# Patient Record
Sex: Male | Born: 1960 | Race: White | Hispanic: No | Marital: Single | Smoking: Current every day smoker
Health system: Southern US, Community
[De-identification: ages and names within clinical notes are randomized; demographics above are authoritative.]

## PROBLEM LIST (undated history)

## (undated) DIAGNOSIS — R351 Nocturia: Secondary | ICD-10-CM

## (undated) DIAGNOSIS — E785 Hyperlipidemia, unspecified: Secondary | ICD-10-CM

## (undated) HISTORY — DX: Nocturia: R35.1

## (undated) HISTORY — DX: Hyperlipidemia, unspecified: E78.5

---

## 2006-09-29 ENCOUNTER — Emergency Department: Payer: Self-pay | Admitting: Emergency Medicine

## 2006-09-30 ENCOUNTER — Other Ambulatory Visit: Payer: Self-pay

## 2006-09-30 ENCOUNTER — Emergency Department: Payer: Self-pay | Admitting: Emergency Medicine

## 2006-10-04 ENCOUNTER — Inpatient Hospital Stay: Payer: Self-pay | Admitting: Internal Medicine

## 2009-02-23 IMAGING — CT CT HEAD WITHOUT CONTRAST
2 series · 16 of 30 positions shown, 20 images · non-contrast
Comparison: none

REASON FOR EXAM: Headache. Rm rme 3
COMMENTS:

[Series 2: without · axial · non-contrast · 0.40mm/px · z∈[+1156,+1276]mm · 13 of 30 slices shown, 17 images]
[im 3/30  brain]
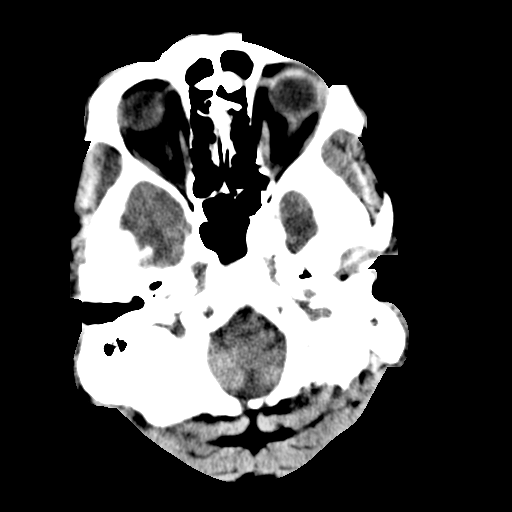
[im 3/30  bone]
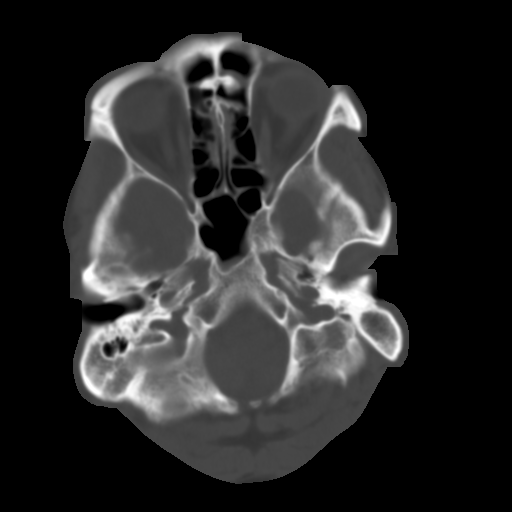
[im 5/30  brain]
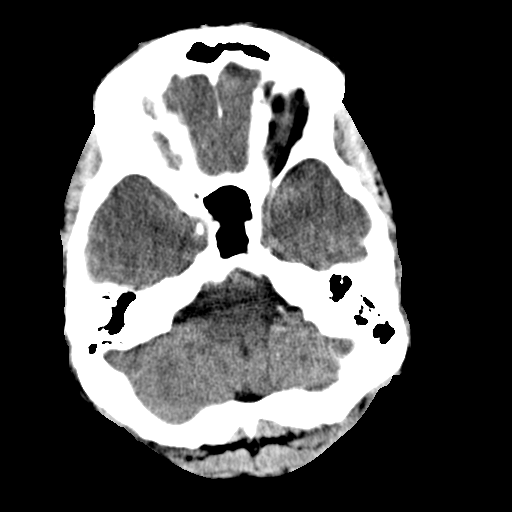
[im 7/30  brain]
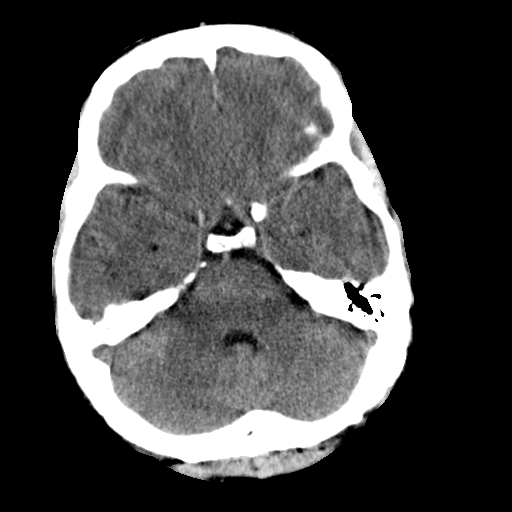
[im 9/30  brain]
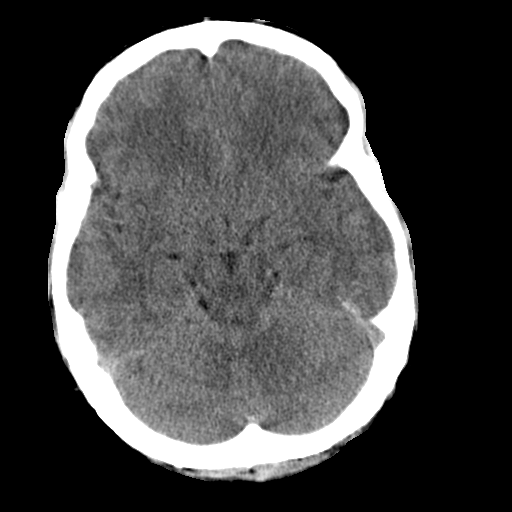
[im 11/30  brain]
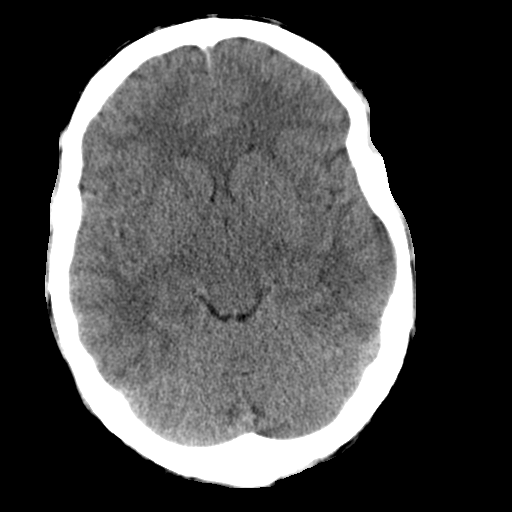
[im 11/30  bone]
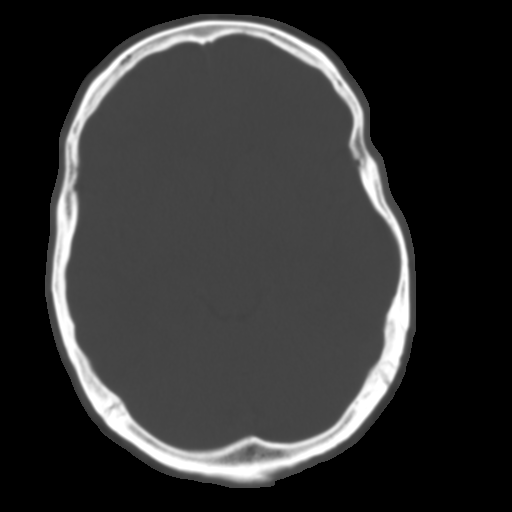
[im 13/30  brain]
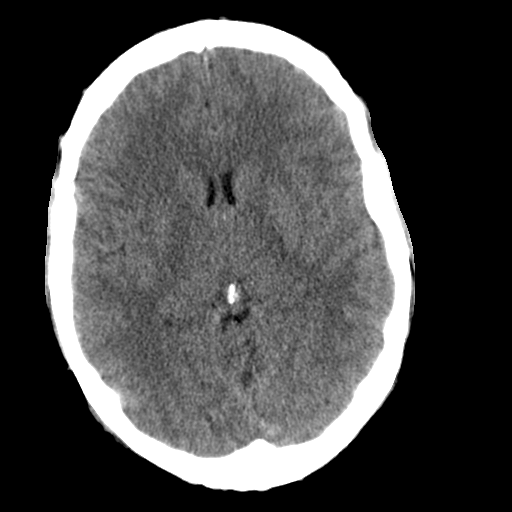
[im 15/30  brain]
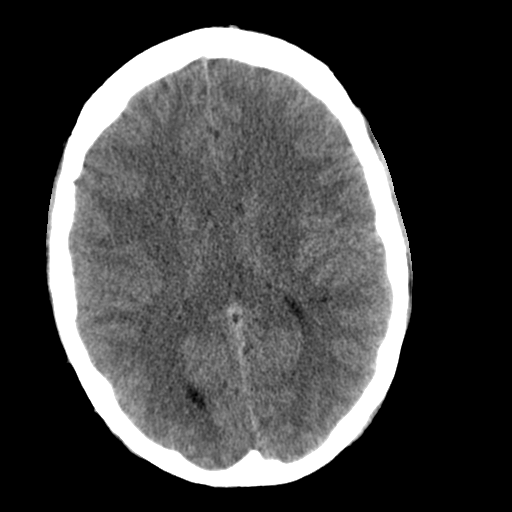
[im 17/30  brain]
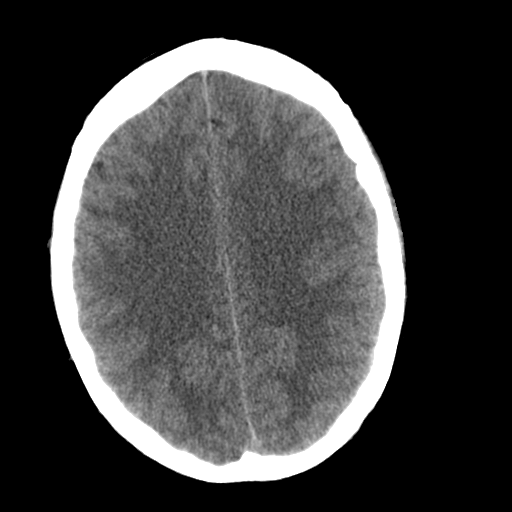
[im 19/30  brain]
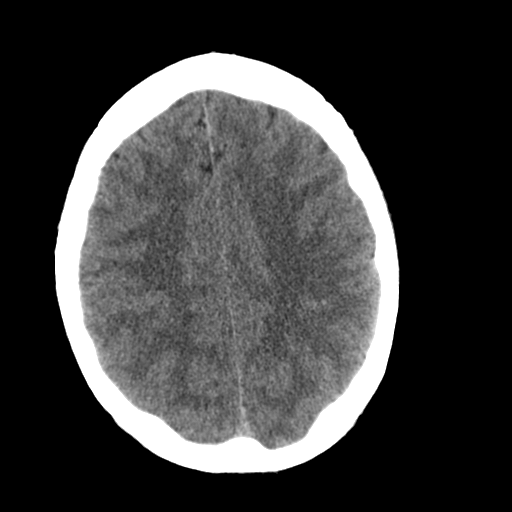
[im 19/30  bone]
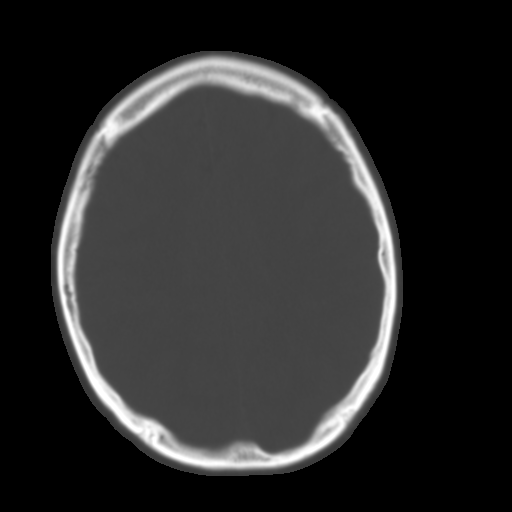
[im 21/30  brain]
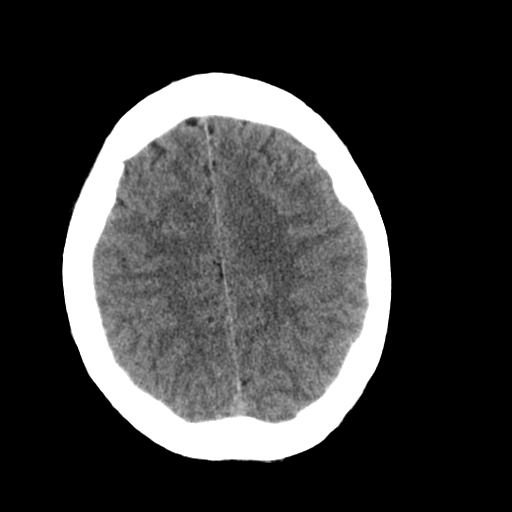
[im 23/30  brain]
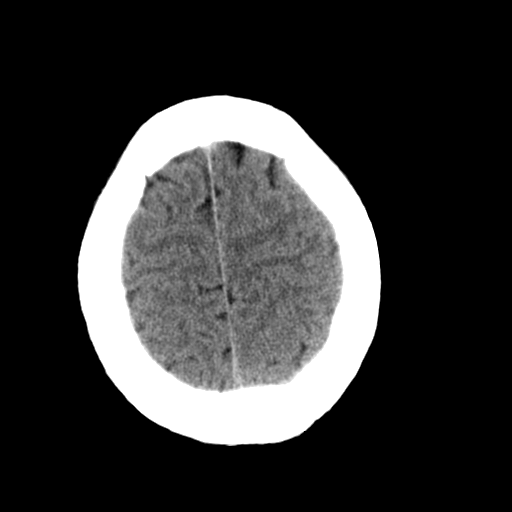
[im 25/30  brain]
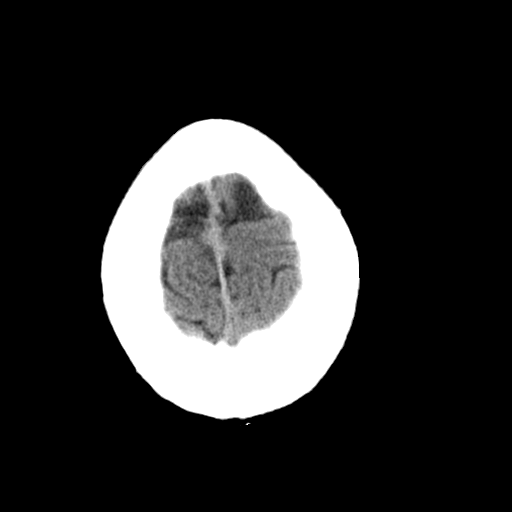
[im 27/30  brain]
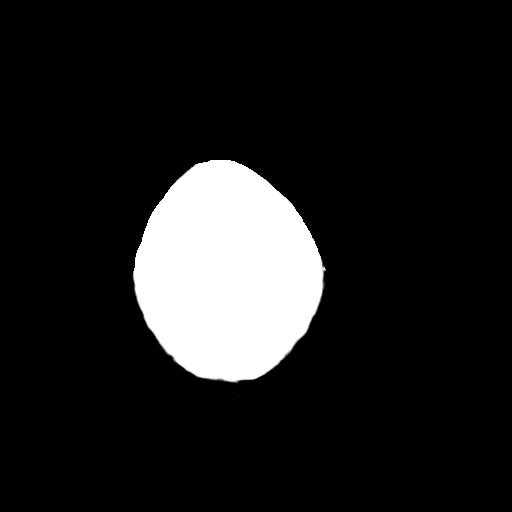
[im 27/30  bone]
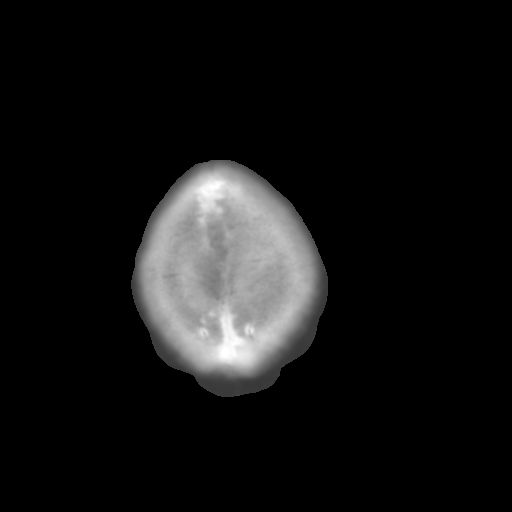

[Series 3: bone · axial · 0.40mm/px · z∈[+1156,+1196]mm · 3 of 30 slices shown]
[im 3/30  bone]
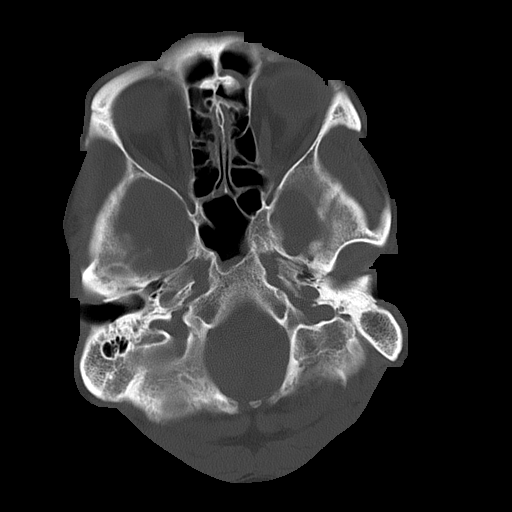
[im 7/30  bone]
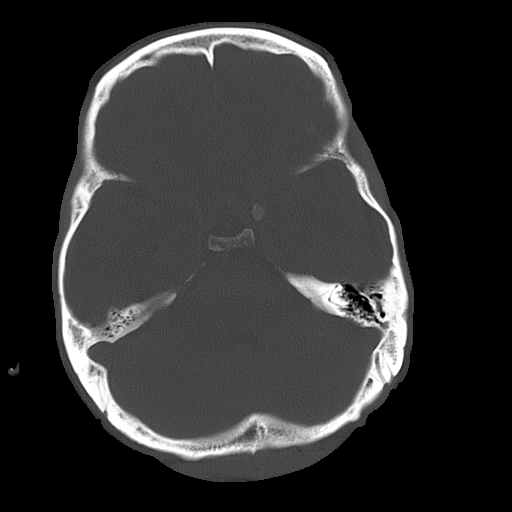
[im 11/30  bone]
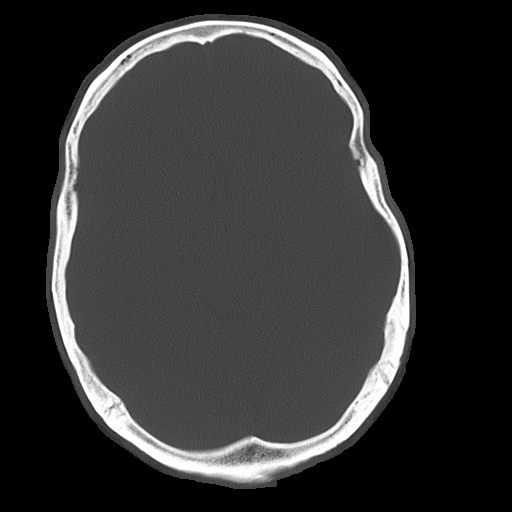

[16 of 30 positions shown; findings below may reference images not displayed]

PROCEDURE:     CT  - CT HEAD WITHOUT CONTRAST  - September 30, 2006  [DATE]

RESULT:     Axial unenhanced images were obtained on an emergency basis.
The report was faxed to the emergency room.  No subarachnoid hemorrhage.  No
intracerebral bleeds or infarcts.  No mass effect.  No shift of the midline.
 No extraaxial fluid collections are evident.  The ventricles appear within
normal limits.  On the bone window settings, the sinuses and mastoids appear
clear.
IMPRESSION: No acute intracranial abnormalities noted.

## 2012-05-02 ENCOUNTER — Ambulatory Visit: Payer: Self-pay | Admitting: Physician Assistant

## 2015-01-09 ENCOUNTER — Ambulatory Visit
Admission: EM | Admit: 2015-01-09 | Discharge: 2015-01-09 | Disposition: A | Discharge status: Home / Self Care | Payer: BLUE CROSS/BLUE SHIELD | Attending: Family Medicine | Admitting: Family Medicine

## 2015-01-09 DIAGNOSIS — F17210 Nicotine dependence, cigarettes, uncomplicated: Secondary | ICD-10-CM | POA: Insufficient documentation

## 2015-01-09 DIAGNOSIS — M79641 Pain in right hand: Secondary | ICD-10-CM | POA: Diagnosis present

## 2015-01-09 DIAGNOSIS — L02511 Cutaneous abscess of right hand: Secondary | ICD-10-CM | POA: Insufficient documentation

## 2015-01-09 MED ORDER — CEFTRIAXONE SODIUM 1 G IJ SOLR
1.0000 g | Freq: Once | INTRAMUSCULAR | Status: AC
  Administered 2015-01-09: 1 g via INTRAMUSCULAR

## 2015-01-09 MED ORDER — AMOXICILLIN-POT CLAVULANATE 875-125 MG PO TABS: 1.0000 | ORAL_TABLET | Freq: Two times a day (BID) | ORAL | Status: DC

## 2015-01-09 NOTE — ED Notes (Signed)
Right index finger cleaned with NS and antibiotic ointment and dry gauze dressing applied.

## 2015-01-09 NOTE — Discharge Instructions (Signed)
Abscess An abscess is an infected area that contains a collection of pus and debris.It can occur in almost any part of the body. An abscess is also known as a furuncle or boil. CAUSES  An abscess occurs when tissue gets infected. This can occur from blockage of oil or sweat glands, infection of hair follicles, or a minor injury to the skin. As the body tries to fight the infection, pus collects in the area and creates pressure under the skin. This pressure causes pain. People with weakened immune systems have difficulty fighting infections and get certain abscesses more often.  SYMPTOMS Usually an abscess develops on the skin and becomes a painful mass that is red, warm, and tender. If the abscess forms under the skin, you may feel a moveable soft area under the skin. Some abscesses break open (rupture) on their own, but most will continue to get worse without care. The infection can spread deeper into the body and eventually into the bloodstream, causing you to feel ill.  DIAGNOSIS  Your caregiver will take your medical history and perform a physical exam. A sample of fluid may also be taken from the abscess to determine what is causing your infection. TREATMENT  Your caregiver may prescribe antibiotic medicines to fight the infection. However, taking antibiotics alone usually does not cure an abscess. Your caregiver may need to make a small cut (incision) in the abscess to drain the pus. In some cases, gauze is packed into the abscess to reduce pain and to continue draining the area. HOME CARE INSTRUCTIONS   Only take over-the-counter or prescription medicines for pain, discomfort, or fever as directed by your caregiver.  If you were prescribed antibiotics, take them as directed. Finish them even if you start to feel better.  If gauze is used, follow your caregiver's directions for changing the gauze.  To avoid spreading the infection:  Keep your draining abscess covered with a  bandage.  Wash your hands well.  Do not share personal care items, towels, or whirlpools with others.  Avoid skin contact with others.  Keep your skin and clothes clean around the abscess.  Keep all follow-up appointments as directed by your caregiver. SEEK MEDICAL CARE IF:   You have increased pain, swelling, redness, fluid drainage, or bleeding.  You have muscle aches, chills, or a general ill feeling.  You have a fever. MAKE SURE YOU:   Understand these instructions.  Will watch your condition.  Will get help right away if you are not doing well or get worse. Document Released: 04/16/2005 Document Revised: 01/06/2012 Document Reviewed: 09/19/2011 [facility] Patient Information 2015 [place], [place]. This information is not intended to replace advice given to you by your health care provider. Make sure you discuss any questions you have with your health care provider.   Warm moist compresses to area Follow up on Friday

## 2015-01-09 NOTE — ED Notes (Signed)
Pt states "I have an infection in my right index finger, my dog bite me about a month ago it was healing but now seems infected. My dog has all of his vaccines."

## 2015-01-09 NOTE — ED Provider Notes (Signed)
CSN: [id number]     Arrival date & time 01/09/15  1658 History   First MD Initiated Contact with Patient 01/09/15 1752     Chief Complaint  Patient presents with  . Hand Pain   (Consider location/radiation/quality/duration/timing/severity/associated sxs/prior Treatment) HPI Comments: 54 yo male with a complaint of infection to his right index finger. Patient states he was bitten at the site by his dog (which is up to date on vaccines per patient) about a month ago. States that it was healing well, but patient started using his hand more, putting more pressure with work and [employer]. Now for the past 4-5 days has noticed more discomfort and swelling.   Patient is a 54 y.o. male presenting with hand pain. The history is provided by the patient.  Hand Pain    History reviewed. No pertinent past medical history. History reviewed. No pertinent past surgical history. History reviewed. No pertinent family history. History  Substance Use Topics  . Smoking status: Current Every Day Smoker  . Smokeless tobacco: Not on file  . Alcohol Use: Yes    Review of Systems  Allergies  Review of patient's allergies indicates no known allergies.  Home Medications   Prior to Admission medications   Medication Sig Start Date End Date Taking? Authorizing Provider  amoxicillin-clavulanate (AUGMENTIN) 875-125 MG per tablet Take 1 tablet by mouth 2 (two) times daily. 01/09/15   Payton Mccallum, MD   BP 129/88 mmHg  Pulse 55  Temp(Src) 98.1 F (36.7 C) (Tympanic)  Resp 16  Ht 5\' 10"  (1.778 m)  Wt 165 lb (74.844 kg)  BMI 23.68 kg/m2  SpO2 98% Physical Exam  Constitutional: He appears well-developed and well-nourished. No distress.  Musculoskeletal:       Right hand: He exhibits tenderness. He exhibits normal range of motion, normal capillary refill, no deformity and no laceration. Normal sensation noted. Normal strength noted.       Hands: Medial PIP area skin on right index finger with slight  swelling with visible pus underneath the skin; no drainage; positive tenderness to palpation; normal range of motion; hand neurovascularly intact  Skin: He is not diaphoretic.  Nursing note and vitals reviewed.   ED Course  Procedures (including critical care time) Labs Review Labs Reviewed  CULTURE, ROUTINE-ABSCESS    Imaging Review No results found.   MDM   1. Abscess of finger, right   (index finger; medial PIP skin)  New Prescriptions   AMOXICILLIN-CLAVULANATE (AUGMENTIN) 875-125 MG PER TABLET    Take 1 tablet by mouth 2 (two) times daily.  Plan: 1. diagnosis reviewed with patient; area skin cleaned and prepped in sterile fashion; skin anesthetized with 1% lidocaine without epinephrine; small superficial incision (43mm) made with a #11 blade with expression of purulent drainage; sample obtained for culture; area bandaged 2. rx as per orders; risks, benefits, potential side effects reviewed with patient 3. Recommend supportive treatment with warm compresses to affected 4.  Patient given Rocephin 1gm IM x 1 in clinic 5. F/u in 3 days or sooner prn if symptoms worsen or don't improve    Payton Mccallum, MD 01/09/15 1845

## 2015-01-14 LAB — CULTURE, ROUTINE-ABSCESS: SPECIAL REQUESTS: NORMAL

## 2015-10-10 ENCOUNTER — Encounter: Payer: Self-pay | Admitting: Podiatry

## 2015-10-10 ENCOUNTER — Ambulatory Visit (INDEPENDENT_AMBULATORY_CARE_PROVIDER_SITE_OTHER): Payer: BLUE CROSS/BLUE SHIELD

## 2015-10-10 ENCOUNTER — Ambulatory Visit (INDEPENDENT_AMBULATORY_CARE_PROVIDER_SITE_OTHER): Payer: BLUE CROSS/BLUE SHIELD | Admitting: Podiatry

## 2015-10-10 VITALS — BP 133/82 | HR 57 | Resp 16

## 2015-10-10 DIAGNOSIS — M722 Plantar fascial fibromatosis: Secondary | ICD-10-CM

## 2015-10-10 MED ORDER — MELOXICAM 15 MG PO TABS: 15.0000 mg | ORAL_TABLET | Freq: Every day | ORAL | Status: DC

## 2015-10-10 MED ORDER — METHYLPREDNISOLONE 4 MG PO TBPK: ORAL_TABLET | ORAL | Status: DC

## 2015-10-10 NOTE — Patient Instructions (Signed)

## 2015-10-10 NOTE — Progress Notes (Signed)
   Subjective:    Patient ID: Bradley Hill, male    DOB: 08/04/1960, 55 y.o.   MRN: [id number]  HPI: He presents today with a 3 month history of pain to his right heel. He states the mornings are particularly bad after he is been sitting for a while and gets back up to walk it is exquisitely painful. Nothing seems to make a difference whether shoe gear or anti-inflammatories.    Review of Systems  Musculoskeletal: Positive for gait problem.  All other systems reviewed and are negative.      Objective:   Physical Exam: Vital signs are stable alert and oriented 3 pulses are strongly palpable. Neurologic sensorium is intact per Semmes-Weinstein monofilament. Deep tendon reflexes are intact bilateral muscle strength is 5 over 5 dorsiflexion plantar flexors and inverters everters on physical musculatures intact. Orthopedic evaluation demonstrates severe pain on palpation medial continued tubercle of the right heel with mild edema and erythema in the area. Radiograph confirms a plantar distally oriented calcaneal heel spur the soft tissue increase in density at the plantar fascial calcaneal insertion site indicative of plantar fasciitis. Cutaneous evaluation and was resected well-hydrated cutis no erythema edema saline as drainage or odor.    Assessment & Plan:  Plantar fasciitis right foot 3 months.  Plan: Discussed etiology pathology conservative or surgical therapies. I injected his right heel today with Kenalog and local anesthetic provided him with a plantar fascial brace and a night splint. Provided him with a prescription for a Medrol Dosepak to be followed by meloxicam. We discussed appropriate shoe gear stretching exercises ice therapy and shoe gear modifications. I will follow-up with him in 1 month. Should he have questions or concerns he will notify C medially or notify doctor on call.

## 2015-11-14 ENCOUNTER — Ambulatory Visit: Payer: BLUE CROSS/BLUE SHIELD | Admitting: Podiatry

## 2016-05-09 ENCOUNTER — Ambulatory Visit: Payer: Self-pay | Admitting: Urology

## 2016-05-16 ENCOUNTER — Ambulatory Visit (INDEPENDENT_AMBULATORY_CARE_PROVIDER_SITE_OTHER): Payer: BLUE CROSS/BLUE SHIELD | Admitting: Urology

## 2016-05-16 ENCOUNTER — Encounter: Payer: Self-pay | Admitting: Urology

## 2016-05-16 VITALS — BP 141/89 | HR 62 | Ht 70.0 in | Wt 168.5 lb

## 2016-05-16 DIAGNOSIS — N401 Enlarged prostate with lower urinary tract symptoms: Secondary | ICD-10-CM

## 2016-05-16 DIAGNOSIS — R351 Nocturia: Secondary | ICD-10-CM | POA: Diagnosis not present

## 2016-05-16 DIAGNOSIS — N138 Other obstructive and reflux uropathy: Secondary | ICD-10-CM | POA: Diagnosis not present

## 2016-05-16 LAB — BLADDER SCAN AMB NON-IMAGING: SCAN RESULT: 0

## 2016-05-16 NOTE — Progress Notes (Signed)
05/16/2016 11:17 AM   Bradley Hill 08/10/1960 [id number]  Referring provider: Marina [place] E Feldpausch, MD [address] [facility] [place] [facility] [place], [place] [id number]  Chief Complaint  Patient presents with  . New Patient (Initial Visit)    Nocturia referred by Dr. Maryjane Hill    HPI: Patient is a 55 -year-old Caucasian male who is referred by Dr. Maryjane Hill for nocturia.  His IPSS score today is 21, which is severe lower urinary tract symptomatology. He is mostly dissatisfied with his quality life due to his urinary symptoms.  His PVR is 0 mL.    His major complaints today are frequency (2 to 3 times daily), urgency (occurs at night) and nocturia (3 to 5 times nightly).  He has had these symptoms for 1 1/2 years.  He denies any dysuria, hematuria or suprapubic pain.   He also denies any recent fevers, chills, nausea or vomiting.  He does not have a family history of PCa.  He has tried restricting fluids somewhat at night without benefit.  His wife has witnessed apneic events while he has been sleeping.  He has not had a sleep study.    He consumes 3 to 5 beers daily.  He drinks 2 cups of coffee daily and sweet tea.        IPSS    Row Name 05/16/16 1000         International Prostate Symptom Score   How often have you had the sensation of not emptying your bladder? Less than half the time     How often have you had to urinate less than every two hours? More than half the time     How often have you found you stopped and started again several times when you urinated? About half the time     How often have you found it difficult to postpone urination? More than half the time     How often have you had a weak urinary stream? Less than half the time     How often have you had to strain to start urination? Less than half the time     How many times did you typically get up at night to urinate? 4 Times     Total IPSS Score 21       Quality of Life due to urinary  symptoms   If you were to spend the rest of your life with your urinary condition just the way it is now how would you feel about that? Mostly Disatisfied        Score:  1-7 Mild 8-19 Moderate 20-35 Severe    PMH: Past Medical History:  Diagnosis Date  . HLD (hyperlipidemia)   . Nocturia     Surgical History: History reviewed. No pertinent surgical history.  Home Medications:    Medication List       Accurate as of 05/16/16 11:17 AM. Always use your most recent med list.          azithromycin 250 MG tablet Commonly known as:  ZITHROMAX Take by mouth daily.   guaiFENesin-codeine 100-10 MG/5ML syrup Commonly known as:  ROBITUSSIN AC Take 5 mLs by mouth 3 (three) times daily as needed for cough.   meloxicam 15 MG tablet Commonly known as:  MOBIC Take 1 tablet (15 mg total) by mouth daily.   methylPREDNISolone 4 MG Tbpk tablet Commonly known as:  MEDROL Tapering 6 day dose pack  Allergies:  Allergies  Allergen Reactions  . Codeine     Family History: Family History  Problem Relation Age of Onset  . Kidney disease Neg Hx   . Prostate cancer Neg Hx     Social History:  reports that he has been smoking.  He has never used smokeless tobacco. He reports that he drinks alcohol. He reports that he does not use drugs.  ROS: UROLOGY Frequent Urination?: Yes Hard to postpone urination?: Yes Burning/pain with urination?: No Get up at night to urinate?: Yes Leakage of urine?: No Urine stream starts and stops?: No Trouble starting stream?: No Do you have to strain to urinate?: No Blood in urine?: No Urinary tract infection?: No Sexually transmitted disease?: No Injury to kidneys or bladder?: No Painful intercourse?: No Weak stream?: No Erection problems?: No Penile pain?: No  Gastrointestinal Nausea?: No Vomiting?: No Indigestion/heartburn?: No Diarrhea?: No Constipation?: No  Constitutional Fever: No Night sweats?: No Weight loss?:  No Fatigue?: No  Skin Skin rash/lesions?: No Itching?: No  Eyes Blurred vision?: No Double vision?: No  Ears/Nose/Throat Sore throat?: No Sinus problems?: No  Hematologic/Lymphatic Swollen glands?: No Easy bruising?: No  Cardiovascular Leg swelling?: No Chest pain?: No  Respiratory Cough?: No Shortness of breath?: No  Endocrine Excessive thirst?: No  Musculoskeletal Back pain?: No Joint pain?: No  Neurological Headaches?: No Dizziness?: No  Psychologic Depression?: No Anxiety?: No  Physical Exam: BP (!) 141/89   Pulse 62   Ht 5\' 10"  (1.778 m)   Wt 168 lb 8 oz (76.4 kg)   BMI 24.18 kg/m   Constitutional: Well nourished. Alert and oriented, No acute distress. HEENT: [place] AT, moist mucus membranes. Trachea midline, no masses. Cardiovascular: No clubbing, cyanosis, or edema. Respiratory: Normal respiratory effort, no increased work of breathing. GI: Abdomen is soft, non tender, non distended, no abdominal masses. Liver and spleen not palpable.  No hernias appreciated.  Stool sample for occult testing is not indicated.   GU: No CVA tenderness.  No bladder fullness or masses.  Patient with circumcised phallus.  Urethral meatus is patent.  No penile discharge. No penile lesions or rashes. Scrotum without lesions, cysts, rashes and/or edema.  Testicles are located scrotally bilaterally. No masses are appreciated in the testicles. Left and right epididymis are normal. Rectal: Patient with  normal sphincter tone. Anus and perineum without scarring or rashes. No rectal masses are appreciated. Prostate is approximately 50 grams, deep median sulcus, no nodules are appreciated. Seminal vesicles are normal. Skin: No rashes, bruises or suspicious lesions. Lymph: No cervical or inguinal adenopathy. Neurologic: Grossly intact, no focal deficits, moving all 4 extremities. Psychiatric: Normal mood and affect.  Laboratory Data: PSA History  0.86 ng/mL on  04/25/2016  Pertinent Imaging: Results for Bradley, Hill (MRN [id number]) as of 05/16/2016 11:16  Ref. Range 05/16/2016 10:50  Scan Result Unknown 0    Assessment & Plan:    1. Nocturia  - I explained to the patient that nocturia is often multi-factorial and difficult to treat.  Sleeping disorders (patient's wife has witnessed apneic events), heart conditions, peripheral vascular disease, diabetes, an enlarged prostate for men, an urethral stricture causing bladder outlet obstruction and/or certain medications can contribute to nocturia.  - I have suggested that the patient avoid caffeine after noon and alcohol in the evening.  He or she may also benefit from fluid restrictions after 6:00 in the evening and voiding just prior to bedtime. - advised patient to limit his alcohol intake  -  I have explained that research studies have showed that over 84% of patients with sleep apnea reported frequent nighttime urination.   With sleep apnea, oxygen decreases, carbon dioxide increases, the blood become more acidic, the heart rate drops and blood vessels in the lung constrict.  The body is then alerted that something is very wrong. The sleeper must wake enough to reopen the airway. By this time, the heart is racing and experiences a false signal of fluid overload. The heart excretes a hormone-like protein that tells the body to get rid of sodium and water, resulting in nocturia.  -  I also informed the patient that a recent study noted that decreasing sodium intake to 2.3 grams daily, if they don't have issues with hyponatremia, can also reduce the number of nightly voids  - The patient may benefit from a discussion with his or her primary care physician to see if he or she has risk factors for sleep apnea or other sleep disturbances and obtaining a sleep study.  - BLADDER SCAN AMB NON-IMAGING  2. BPH with LUTS  - IPSS score is 21/4  - Continue conservative management, avoiding bladder irritants and timed  voiding's  - PVR 0 mL, give a trial of Myrbetriq 25 mg daily # 28, samples given- I have advised the patient of the side effects of Myrbetriq, such as: elevation in BP, urinary retention and/or HA.- did advise patient that it will most likely not help with the nocturia as he probably has sleep apnea  - RTC in 3 weeks for IPSS and PVR   Return in about 3 weeks (around 06/06/2016) for IPSS and PVR .  These notes generated with voice recognition software. I apologize for typographical errors.  Michiel Cowboy, PA-C  [facility] [address] [place], [place] [address] [phone]

## 2016-06-06 ENCOUNTER — Ambulatory Visit: Payer: BLUE CROSS/BLUE SHIELD | Admitting: Urology

## 2016-06-20 ENCOUNTER — Encounter: Payer: Self-pay | Admitting: Urology

## 2016-06-20 ENCOUNTER — Ambulatory Visit (INDEPENDENT_AMBULATORY_CARE_PROVIDER_SITE_OTHER): Payer: BLUE CROSS/BLUE SHIELD | Admitting: Urology

## 2016-06-20 VITALS — BP 132/92 | HR 65 | Ht 70.0 in | Wt 166.5 lb

## 2016-06-20 DIAGNOSIS — N401 Enlarged prostate with lower urinary tract symptoms: Secondary | ICD-10-CM

## 2016-06-20 DIAGNOSIS — N138 Other obstructive and reflux uropathy: Secondary | ICD-10-CM

## 2016-06-20 DIAGNOSIS — N400 Enlarged prostate without lower urinary tract symptoms: Secondary | ICD-10-CM | POA: Diagnosis not present

## 2016-06-20 DIAGNOSIS — R351 Nocturia: Secondary | ICD-10-CM

## 2016-06-20 LAB — BLADDER SCAN AMB NON-IMAGING: Scan Result: 0

## 2016-06-20 NOTE — Progress Notes (Signed)
06/20/2016 10:02 AM   Bradley Hill 09/26/1960 [id number]  Referring provider: Marina Goodellale E Feldpausch, MD [address] [facility] [place], [place] [id number]  Chief Complaint  Patient presents with  . Benign Prostatic Hypertrophy    3 week follow up  . Nocturia    HPI: Patient is a 55 year old Caucasian male who was placed on Myrbetriq 25 mg daily for his urinary frequency, urgency and nocturia for three-week trial and he presents today for follow-up.  Background history Patient was referred by Bradley Hill for nocturia.   His previous IPSS score was 21, which is severe lower urinary tract symptomatology. He was mostly dissatisfied with his quality life due to his urinary symptoms.  His PVR was 0 mL.  His major complaints were frequency (2 to 3 times daily), urgency (occurs at night) and nocturia (3 to 5 times nightly).  He has had these symptoms for 1 1/2 years.  He denies any dysuria, hematuria or suprapubic pain.  He also denies any recent fevers, chills, nausea or vomiting.  He does not have a family history of PCa.  He has tried restricting fluids somewhat at night without benefit.  His wife has witnessed apneic events while he has been sleeping.  He has not had a sleep study.   He consumes 3 to 5 beers daily.  He drinks 2 cups of coffee daily and sweet tea.    He was initiated on Myrbetriq 25 mg daily for his symptoms. He states he noted an improvement in his urinary symptoms, but it caused constipation and he has been having take a laxative with the medication. His I PSS score today is 19 which is moderate lower urinary tract symptoms. He is mostly dissatisfied with his urinary tract symptoms at this time. His PVR is 0 mL.     IPSS    Row Name 05/16/16 1000 06/20/16 0900       International Prostate Symptom Score   How often have you had the sensation of not emptying your bladder? Less than half the time Less than half the time    How often have you had  to urinate less than every two hours? More than half the time More than half the time    How often have you found you stopped and started again several times when you urinated? About half the time About half the time    How often have you found it difficult to postpone urination? More than half the time Less than half the time    How often have you had a weak urinary stream? Less than half the time About half the time    How often have you had to strain to start urination? Less than half the time Less than 1 in 5 times    How many times did you typically get up at night to urinate? 4 Times 4 Times    Total IPSS Score 21 19      Quality of Life due to urinary symptoms   If you were to spend the rest of your life with your urinary condition just the way it is now how would you feel about that? Mostly Disatisfied Mostly Disatisfied       Score:  1-7 Mild 8-19 Moderate 20-35 Severe    PMH: Past Medical History:  Diagnosis Date  . HLD (hyperlipidemia)   . Nocturia     Surgical History: History reviewed. No pertinent surgical history.  Home Medications:    Medication List       Accurate as of 06/20/16 10:02 AM. Always use your most recent med list.          azithromycin 250 MG tablet Commonly known as:  ZITHROMAX Take by mouth daily.   guaiFENesin-codeine 100-10 MG/5ML syrup Commonly known as:  ROBITUSSIN AC Take 5 mLs by mouth 3 (three) times daily as needed for cough.   LAXATIVE PO Take by mouth. Takes with the myrbetriq   meloxicam 15 MG tablet Commonly known as:  MOBIC Take 1 tablet (15 mg total) by mouth daily.   methylPREDNISolone 4 MG Tbpk tablet Commonly known as:  MEDROL Tapering 6 day dose pack   mirabegron ER 25 MG Tb24 tablet Commonly known as:  MYRBETRIQ Take by mouth.   polyethylene glycol-electrolytes 420 g solution Commonly known as:  NuLYTELY/GoLYTELY Take by mouth.       Allergies:  Allergies  Allergen Reactions  . Codeine   .  Hydrocodone Rash    itching    Family History: Family History  Problem Relation Age of Onset  . Kidney disease Neg Hx   . Prostate cancer Neg Hx   . Bladder Cancer Neg Hx     Social History:  reports that he has been smoking.  He has never used smokeless tobacco. He reports that he drinks alcohol. He reports that he does not use drugs.  ROS: UROLOGY Frequent Urination?: Yes Hard to postpone urination?: No Burning/pain with urination?: No Get up at night to urinate?: Yes Leakage of urine?: No Urine stream starts and stops?: Yes Trouble starting stream?: No Do you have to strain to urinate?: No Blood in urine?: No Urinary tract infection?: No Sexually transmitted disease?: No Injury to kidneys or bladder?: No Painful intercourse?: No Weak stream?: Yes Erection problems?: No Penile pain?: No  Gastrointestinal Nausea?: No Vomiting?: No Indigestion/heartburn?: No Diarrhea?: No Constipation?: Yes  Constitutional Fever: No Night sweats?: No Weight loss?: No Fatigue?: No  Skin Skin rash/lesions?: No Itching?: No  Eyes Blurred vision?: No Double vision?: No  Ears/Nose/Throat Sore throat?: No Sinus problems?: No  Hematologic/Lymphatic Swollen glands?: No Easy bruising?: No  Cardiovascular Leg swelling?: No Chest pain?: No  Respiratory Cough?: No Shortness of breath?: No  Endocrine Excessive thirst?: No  Musculoskeletal Back pain?: No Joint pain?: No  Neurological Headaches?: No Dizziness?: No  Psychologic Depression?: No Anxiety?: No  Physical Exam: BP (!) 132/92   Pulse 65   Ht 5\' 10"  (1.778 m)   Wt 166 lb 8 oz (75.5 kg)   BMI 23.89 kg/m   Constitutional: Well nourished. Alert and oriented, No acute distress. HEENT: [place] AT, moist mucus membranes. Trachea midline, no masses. Cardiovascular: No clubbing, cyanosis, or edema. Respiratory: Normal respiratory effort, no increased work of breathing. Skin: No rashes, bruises or  suspicious lesions. Lymph: No cervical or inguinal adenopathy. Neurologic: Grossly intact, no focal deficits, moving all 4 extremities. Psychiatric: Normal mood and affect.  Laboratory Data: PSA History  0.86 ng/mL on 04/25/2016  Pertinent Imaging: Results for Bradley BlackwaterCOUCH, Cruise T (MRN [id number]) as of 06/20/2016 11:11  Ref. Range 06/20/2016 09:47  Scan Result Unknown 0   Assessment & Plan:    1. Nocturia  - Encouraged the patient to speak with Bradley Hill about having a sleep study   2. BPH with LUTS  - IPSS score is 19/4, it is improving  - Continue conservative management, avoiding bladder irritants and timed voiding's  - PVR 0 mL,  give a trial of Toviaz 8 mg, # 35 samples given-  I have advised him of the side effects of Toviaz, such as: Dry eyes, dry mouth, constipation, mental confusion and/or urinary retention. Perhaps he will have paradoxical response and not experience constipation as he did with the Myrbetriq  - RTC in 1 month for IPSS and PVR   Return in about 1 month (around 07/21/2016) for IPSS and PVR.  These notes generated with voice recognition software. I apologize for typographical errors.  Michiel Cowboy, PA-C  [facility] [address] [place], [place] [address] [phone]

## 2016-07-25 ENCOUNTER — Ambulatory Visit: Payer: BLUE CROSS/BLUE SHIELD | Admitting: Urology

## 2016-07-25 NOTE — Progress Notes (Deleted)
07/25/2016 9:25 AM   Bradley Hill 01/13/1961 [id number]  Referring provider: Marina Goodellale E Feldpausch, MD [address] [facility] [place] [facility] [place], [place] [id number]  No chief complaint on file.   HPI: Patient is a 56 year old Caucasian male who was placed on Toviaz 8 mg daily for his urinary frequency, urgency and nocturia for three-week trial and he presents today for follow-up.  Background history Patient was referred by Dr. Maryjane HurterFeldpausch for nocturia.   His previous IPSS score was 21, which is severe lower urinary tract symptomatology. He was mostly dissatisfied with his quality life due to his urinary symptoms.  His PVR was 0 mL.  His major complaints were frequency (2 to 3 times daily), urgency (occurs at night) and nocturia (3 to 5 times nightly).  He has had these symptoms for 1 1/2 years.  He denies any dysuria, hematuria or suprapubic pain.  He also denies any recent fevers, chills, nausea or vomiting.  He does not have a family history of PCa.  He has tried restricting fluids somewhat at night without benefit.  His wife has witnessed apneic events while he has been sleeping.  He has not had a sleep study.   He consumes 3 to 5 beers daily.  He drinks 2 cups of coffee daily and sweet tea.    He was initiated on Myrbetriq 25 mg daily for his symptoms at his last visit last month.  He states he noted an improvement in his urinary symptoms, but it caused constipation and he has been having take a laxative with the medication.  His  I PSS score today is *** which is moderate lower urinary tract symptoms. He is mostly dissatisfied with his urinary tract symptoms at this time. His PVR is *** mL.  His previous I PSS score was 19/4.  His previous PVR was 0 mL.       IPSS    Row Name 06/20/16 0900         International Prostate Symptom Score   How often have you had the sensation of not emptying your bladder? Less than half the time     How often have you had to urinate less  than every two hours? More than half the time     How often have you found you stopped and started again several times when you urinated? About half the time     How often have you found it difficult to postpone urination? Less than half the time     How often have you had a weak urinary stream? About half the time     How often have you had to strain to start urination? Less than 1 in 5 times     How many times did you typically get up at night to urinate? 4 Times     Total IPSS Score 19       Quality of Life due to urinary symptoms   If you were to spend the rest of your life with your urinary condition just the way it is now how would you feel about that? Mostly Disatisfied        Score:  1-7 Mild 8-19 Moderate 20-35 Severe    PMH: Past Medical History:  Diagnosis Date  . HLD (hyperlipidemia)   . Nocturia     Surgical History: No past surgical history on file.  Home Medications:  Allergies as of 07/25/2016      Reactions   Codeine  Hydrocodone Rash   itching      Medication List       Accurate as of 07/25/16  9:25 AM. Always use your most recent med list.          azithromycin 250 MG tablet Commonly known as:  ZITHROMAX Take by mouth daily.   guaiFENesin-codeine 100-10 MG/5ML syrup Commonly known as:  ROBITUSSIN AC Take 5 mLs by mouth 3 (three) times daily as needed for cough.   LAXATIVE PO Take by mouth. Takes with the myrbetriq   meloxicam 15 MG tablet Commonly known as:  MOBIC Take 1 tablet (15 mg total) by mouth daily.   methylPREDNISolone 4 MG Tbpk tablet Commonly known as:  MEDROL Tapering 6 day dose pack   mirabegron ER 25 MG Tb24 tablet Commonly known as:  MYRBETRIQ Take by mouth.   polyethylene glycol-electrolytes 420 g solution Commonly known as:  NuLYTELY/GoLYTELY Take by mouth.       Allergies:  Allergies  Allergen Reactions  . Codeine   . Hydrocodone Rash    itching    Family History: Family History  Problem Relation  Age of Onset  . Kidney disease Neg Hx   . Prostate cancer Neg Hx   . Bladder Cancer Neg Hx     Social History:  reports that he has been smoking.  He has never used smokeless tobacco. He reports that he drinks alcohol. He reports that he does not use drugs.  ROS:                                        Physical Exam: There were no vitals taken for this visit.  Constitutional: Well nourished. Alert and oriented, No acute distress. HEENT: [place] AT, moist mucus membranes. Trachea midline, no masses. Cardiovascular: No clubbing, cyanosis, or edema. Respiratory: Normal respiratory effort, no increased work of breathing. Skin: No rashes, bruises or suspicious lesions. Lymph: No cervical or inguinal adenopathy. Neurologic: Grossly intact, no focal deficits, moving all 4 extremities. Psychiatric: Normal mood and affect.  Laboratory Data: PSA History  0.86 ng/mL on 04/25/2016  Pertinent Imaging: ***  Assessment & Plan:    1. Nocturia  - Encouraged the patient to speak with Dr. [name] about having a sleep study   2. BPH with LUTS  - IPSS score is 19/4, it is improving  - Continue conservative management, avoiding bladder irritants and timed voiding's  - PVR 0 mL, give a trial of Toviaz 8 mg, # 35 samples given-  I have advised him of the side effects of Toviaz, such as: Dry eyes, dry mouth, constipation, mental confusion and/or urinary retention. Perhaps he will have paradoxical response and not experience constipation as he did with the Myrbetriq  - RTC in 1 month for IPSS and PVR   No Follow-up on file.  These notes generated with voice recognition software. I apologize for typographical errors.  Michiel Cowboy, PA-C  [facility] [address] [place], [place] [address] [phone]

## 2016-08-29 ENCOUNTER — Ambulatory Visit
Admission: RE | Admit: 2016-08-29 | Payer: BLUE CROSS/BLUE SHIELD | Source: Ambulatory Visit | Admitting: Unknown Physician Specialty

## 2016-08-29 ENCOUNTER — Encounter: Admission: RE | Payer: Self-pay | Source: Ambulatory Visit

## 2016-08-29 SURGERY — COLONOSCOPY WITH PROPOFOL
Anesthesia: General

## 2017-08-10 ENCOUNTER — Other Ambulatory Visit: Payer: Self-pay

## 2017-08-10 ENCOUNTER — Encounter: Payer: Self-pay | Admitting: Emergency Medicine

## 2017-08-10 ENCOUNTER — Ambulatory Visit (INDEPENDENT_AMBULATORY_CARE_PROVIDER_SITE_OTHER): Payer: BLUE CROSS/BLUE SHIELD

## 2017-08-10 ENCOUNTER — Ambulatory Visit
Admission: EM | Admit: 2017-08-10 | Discharge: 2017-08-10 | Disposition: A | Discharge status: Home / Self Care | Payer: BLUE CROSS/BLUE SHIELD | Attending: Emergency Medicine | Admitting: Emergency Medicine

## 2017-08-10 DIAGNOSIS — R062 Wheezing: Secondary | ICD-10-CM

## 2017-08-10 DIAGNOSIS — J189 Pneumonia, unspecified organism: Secondary | ICD-10-CM

## 2017-08-10 DIAGNOSIS — J181 Lobar pneumonia, unspecified organism: Secondary | ICD-10-CM

## 2017-08-10 DIAGNOSIS — R05 Cough: Secondary | ICD-10-CM

## 2017-08-10 DIAGNOSIS — J0140 Acute pansinusitis, unspecified: Secondary | ICD-10-CM | POA: Diagnosis not present

## 2017-08-10 MED ORDER — DOXYCYCLINE HYCLATE 100 MG PO CAPS: 100.0000 mg | ORAL_CAPSULE | Freq: Two times a day (BID) | ORAL | 0 refills | Status: AC

## 2017-08-10 MED ORDER — PREDNISONE 20 MG PO TABS: 40.0000 mg | ORAL_TABLET | Freq: Every day | ORAL | 0 refills | Status: AC

## 2017-08-10 MED ORDER — AEROCHAMBER PLUS MISC: 2 refills | Status: DC

## 2017-08-10 MED ORDER — IPRATROPIUM-ALBUTEROL 0.5-2.5 (3) MG/3ML IN SOLN
3.0000 mL | Freq: Once | RESPIRATORY_TRACT | Status: AC
  Administered 2017-08-10: 3 mL via RESPIRATORY_TRACT

## 2017-08-10 MED ORDER — BENZONATATE 200 MG PO CAPS: 200.0000 mg | ORAL_CAPSULE | Freq: Three times a day (TID) | ORAL | 0 refills | Status: DC | PRN

## 2017-08-10 MED ORDER — ALBUTEROL SULFATE HFA 108 (90 BASE) MCG/ACT IN AERS: 2.0000 | INHALATION_SPRAY | RESPIRATORY_TRACT | 0 refills | Status: DC | PRN

## 2017-08-10 MED ORDER — IBUPROFEN 600 MG PO TABS: 600.0000 mg | ORAL_TABLET | Freq: Four times a day (QID) | ORAL | 0 refills | Status: DC | PRN

## 2017-08-10 NOTE — ED Provider Notes (Signed)
HPI  SUBJECTIVE:  Bradley Hill is a 57 y.o. [place] Blackwat[place] who presents with 1 week of fevers 101, sore throat, sinus pain and pressure and a dry cough.  He tried ibuprofen 200 mg with temporary fever reduction and Alka-Seltzer cold without much improvement of symptoms.  No aggravating factors.  He denies nasal congestion, rhinorrhea, postnasal drip, allergy type symptoms.  No upper dental pain.  No facial swelling.  No body aches, headache, hemoptysis, posttussive emesis.  He denies wheezing, chest pain, shortness of breath, dyspnea on exertion.  No GERD symptoms.  No antipyretic in the past 6-8 hours, antibiotics in the past month.  States he is unable to sleep at night secondary to the cough.  He has a past medical history of smoking, no history of asthma, eczema, COPD, diabetes, hypertension.  PMD: Marina GoodellFeldpausch, Dale E, MD   Past Medical History:  Diagnosis Date  . HLD (hyperlipidemia)   . Nocturia     History reviewed. No pertinent surgical history.  Family History  Problem Relation Age of Onset  . Kidney disease Neg Hx   . Prostate cancer Neg Hx   . Bladder Cancer Neg Hx     Social History   Tobacco Use  . Smoking status: Current Every Day Smoker  . Smokeless tobacco: Never Used  Substance Use Topics  . Alcohol use: Yes    Alcohol/week: 0.0 oz  . Drug use: No    No current facility-administered medications for this encounter.   Current Outpatient Medications:  .  albuterol (PROVENTIL HFA;VENTOLIN HFA) 108 (90 Base) MCG/ACT inhaler, Inhale 2 puffs into the lungs every 4 (four) hours as needed for wheezing or shortness of breath. Dispense with aerochamber, Disp: 1 Inhaler, Rfl: 0 .  benzonatate (TESSALON) 200 MG capsule, Take 1 capsule (200 mg total) by mouth 3 (three) times daily as needed for cough., Disp: 30 capsule, Rfl: 0 .  doxycycline (VIBRAMYCIN) 100 MG capsule, Take 1 capsule (100 mg total) by mouth 2 (two) times daily for 7 days., Disp: 14 capsule, Rfl: 0 .  ibuprofen  (ADVIL,MOTRIN) 600 MG tablet, Take 1 tablet (600 mg total) by mouth every 6 (six) hours as needed., Disp: 30 tablet, Rfl: 0 .  mirabegron ER (MYRBETRIQ) 25 MG TB24 tablet, Take by mouth., Disp: , Rfl:  .  predniSONE (DELTASONE) 20 MG tablet, Take 2 tablets (40 mg total) by mouth daily with breakfast for 5 days., Disp: 10 tablet, Rfl: 0 .  Spacer/Aero-Holding Chambers (AEROCHAMBER PLUS) inhaler, Use as instructed, Disp: 1 each, Rfl: 2  Allergies  Allergen Reactions  . Codeine   . Hydrocodone Rash    itching     ROS  As noted in HPI.   Physical Exam  BP (!) 150/92 (BP Location: Left Arm)   Pulse 70   Temp 98.8 F (37.1 C) (Oral)   Resp 16   Ht 5\' 10"  (1.778 m)   Wt 165 lb (74.8 kg)   SpO2 98%   BMI 23.68 kg/m   Constitutional: Well developed, well nourished, no acute distress Eyes:  EOMI, conjunctiva normal bilaterally HENT: Normocephalic, atraumatic,mucus membranes moist positive purulent nasal congestion.  Erythematous, swollen turbinates.  Positive maxillary and frontal sinus tenderness, mild.  Positive postnasal drip and cobblestoning. Respiratory: Normal inspiratory effort.  scattered wheezing and rhonchi posterior right middle lobe and at the bases bilaterally Cardiovascular: Normal rate GI: nondistended skin: No rash, skin intact  Musculoskeletal: no deformities Neurologic: Alert & orienTed x 3, no focal neuro deficits Psychiatric:  Speech and behavior appropriate   ED Course   Medications  ipratropium-albuterol (DUONEB) 0.5-2.5 (3) MG/3ML nebulizer solution 3 mL (3 mLs Nebulization Given 08/10/17 1427)    Orders Placed This Encounter  Procedures  . DG Chest 2 View    Standing Status:   Standing    Number of Occurrences:   1    Order Specific Question:   Reason for Exam (SYMPTOM  OR DIAGNOSIS REQUIRED)    Answer:   cough fever r/o pna    No results found for this or any previous visit (from the past 24 hour(s)). Dg Chest 2 View  Result Date:  08/10/2017 CLINICAL DATA:  Dry cough for 1 week with fever EXAM: CHEST  2 VIEW COMPARISON:  09/29/2006 FINDINGS: There is significant consolidation in the lateral segment of the right middle lobe. Lungs are otherwise clear. Normal heart size. No pneumothorax. No pleural effusion. IMPRESSION: Right middle lobe pneumonia. Followup PA and lateral chest X-ray is recommended in 3-4 weeks following trial of antibiotic therapy to ensure resolution and exclude underlying malignancy. Electronically Signed   By: Jolaine Click M.D.   On: 08/10/2017 14:23    ED Clinical Impression  Community acquired pneumonia of right middle lobe of lung (HCC)  Acute non-recurrent pansinusitis   ED Assessment/Plan  Reviewed imaging independently.  Right middle lobe pneumonia.  Repeat chest x-ray recommended in 3-4 weeks to ensure resolution and exclude underlying malignancy.  See radiology report for details.  Patient was given a DuoNeb.  On reevaluation, patient states he feels better.  Repeat exam, states that he feels better.  Lungs are clear.   Plan to send home with doxycycline which will cover both sinusitis and a pneumonia, Flonase, advised Mucinex D, ibuprofen 600 mg take with 1 g of Tylenol 3-4 times a day as needed.  Albuterol inhaler with a spacer, Tessalon as patient states he cannot tolerate opiates.  Also 40 mg of prednisone for 5 days as I suspect patient has an element of COPD.  He will follow-up with his primary care physician for repeat chest x-ray in 3-4 weeks.  He will go to the ER if he gets worse.  Discussed imaging, MDM, plan and followup with patient. Discussed sn/sx that should prompt return to the ED. patient agrees with plan.   Meds ordered this encounter  Medications  . ipratropium-albuterol (DUONEB) 0.5-2.5 (3) MG/3ML nebulizer solution 3 mL  . albuterol (PROVENTIL HFA;VENTOLIN HFA) 108 (90 Base) MCG/ACT inhaler    Sig: Inhale 2 puffs into the lungs every 4 (four) hours as needed for  wheezing or shortness of breath. Dispense with aerochamber    Dispense:  1 Inhaler    Refill:  0  . doxycycline (VIBRAMYCIN) 100 MG capsule    Sig: Take 1 capsule (100 mg total) by mouth 2 (two) times daily for 7 days.    Dispense:  14 capsule    Refill:  0  . Spacer/Aero-Holding Chambers (AEROCHAMBER PLUS) inhaler    Sig: Use as instructed    Dispense:  1 each    Refill:  2  . benzonatate (TESSALON) 200 MG capsule    Sig: Take 1 capsule (200 mg total) by mouth 3 (three) times daily as needed for cough.    Dispense:  30 capsule    Refill:  0  . predniSONE (DELTASONE) 20 MG tablet    Sig: Take 2 tablets (40 mg total) by mouth daily with breakfast for 5 days.    Dispense:  10  tablet    Refill:  0  . ibuprofen (ADVIL,MOTRIN) 600 MG tablet    Sig: Take 1 tablet (600 mg total) by mouth every 6 (six) hours as needed.    Dispense:  30 tablet    Refill:  0    *This clinic note was created using Scientist, clinical (histocompatibility and immunogenetics). Therefore, there may be occasional mistakes despite careful proofreading.   ?   Domenick Gong, MD 08/10/17 2012

## 2017-08-10 NOTE — ED Triage Notes (Signed)
Patient c/o cough, chest congestion and low grade fever that started a week ago.

## 2017-08-10 NOTE — Discharge Instructions (Signed)
2 puffs from your albuterol inhaler using a spacer every 4-6 hours.  Finish the antibiotics unless your doctor tells you to stop.  Tessalon Perles to help with the cough.  Start some saline nasal irrigation with a Lloyd HugerNeil med sinus rinse to help wash the infection out from your sinuses.  Follow directions on the box.  You may do this as often as you want.

## 2017-08-13 ENCOUNTER — Telehealth: Payer: Self-pay

## 2017-08-13 NOTE — Telephone Encounter (Signed)
Called to follow up with patient since visit here at [facility]. No VM set up.  Patient will call back with any questions or concerns. MAH  

## 2020-01-04 IMAGING — CR DG CHEST 2V
2 series · 2 of 2 positions shown · non-contrast
Comparison: 09/29/2006

CLINICAL DATA: Dry cough for 1 week with fever

EXAM:
CHEST  2 VIEW

[chest pa]
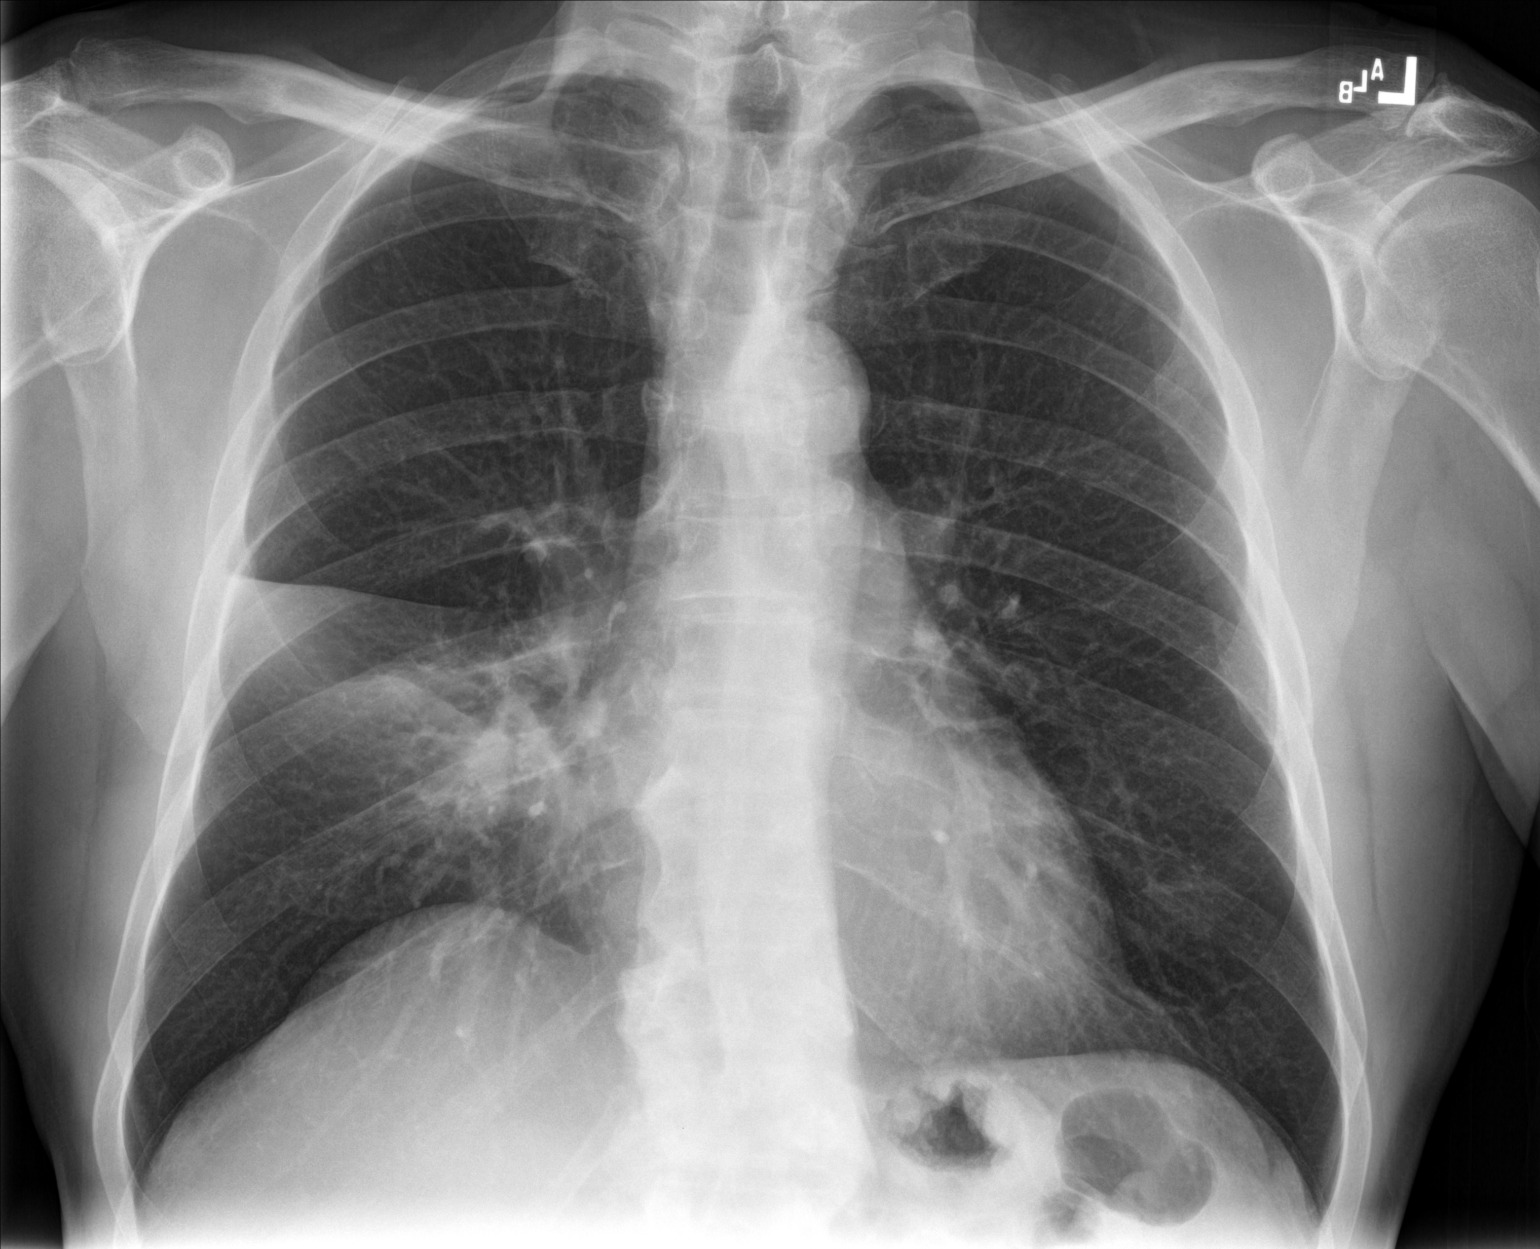

[chest lat]
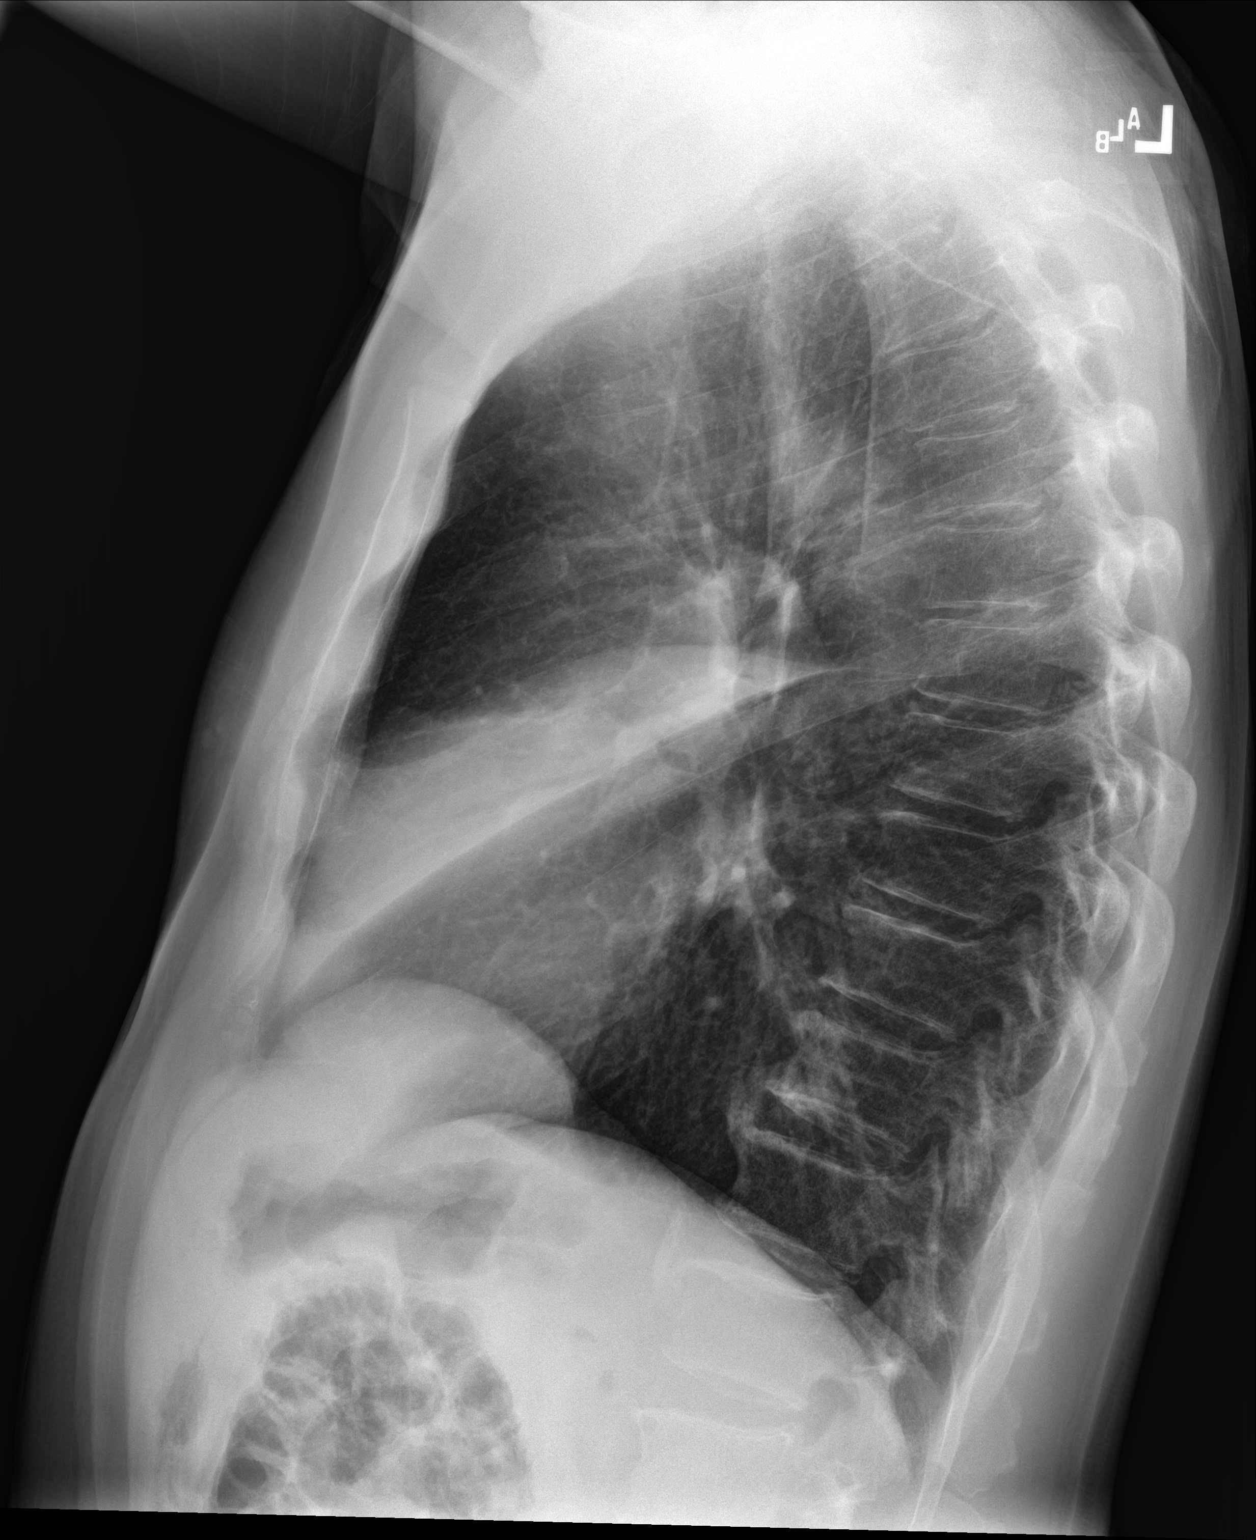

[2 of 2 positions shown; findings below may reference images not displayed]

FINDINGS: There is significant consolidation in the lateral segment of the
right middle lobe. Lungs are otherwise clear. Normal heart size. No
pneumothorax. No pleural effusion.
IMPRESSION: Right middle lobe pneumonia. Followup PA and lateral chest X-ray is
recommended in 3-4 weeks following trial of antibiotic therapy to
ensure resolution and exclude underlying malignancy.

## 2022-01-14 ENCOUNTER — Ambulatory Visit
Admission: EM | Admit: 2022-01-14 | Discharge: 2022-01-14 | Disposition: A | Discharge status: Home / Self Care | Payer: BLUE CROSS/BLUE SHIELD | Attending: Family | Admitting: Family

## 2022-01-14 DIAGNOSIS — H6691 Otitis media, unspecified, right ear: Secondary | ICD-10-CM

## 2022-01-14 DIAGNOSIS — J029 Acute pharyngitis, unspecified: Secondary | ICD-10-CM

## 2022-01-14 DIAGNOSIS — R0981 Nasal congestion: Secondary | ICD-10-CM

## 2022-01-14 DIAGNOSIS — R051 Acute cough: Secondary | ICD-10-CM

## 2022-01-14 MED ORDER — AMOXICILLIN-POT CLAVULANATE 875-125 MG PO TABS: 1.0000 | ORAL_TABLET | Freq: Two times a day (BID) | ORAL | 0 refills | Status: AC

## 2022-01-14 NOTE — ED Provider Notes (Signed)
[facility]    CSN: [id number] Arrival date & time: 01/14/22  0834      History   Chief Complaint Chief Complaint  Patient presents with   Sore Throat   Fever   Nasal Congestion   Chills    HPI Bradley Hill is a 61 y.o. male.   61 year old male presents with sore throat, cough, nasal congestion for the past 3 to 4 days.  Developed chills, fever up to 101.2 and right ear pain yesterday.  Unable to sleep due to pain and symptoms.  Has also noticed a decrease in taste and smell.  Denies any GI symptoms.  Has taken 2 home COVID tests which were negative.  No other family members ill.  Has taken ibuprofen and Tylenol with minimal relief.  No other current chronic health issues.  Takes no daily medication.  The history is provided by the patient.    Past Medical History:  Diagnosis Date   HLD (hyperlipidemia)    Nocturia     There are no problems to display for this patient.   History reviewed. No pertinent surgical history.     Home Medications    Prior to Admission medications   Medication Sig Start Date End Date Taking? Authorizing Provider  amoxicillin-clavulanate (AUGMENTIN) 875-125 MG tablet Take 1 tablet by mouth every 12 (twelve) hours for 7 days. 01/14/22 01/21/22 Yes Ronie Fleeger, Ali Lowe, NP    Family History Family History  Problem Relation Age of Onset   Kidney disease Neg Hx    Prostate cancer Neg Hx    Bladder Cancer Neg Hx     Social History Social History   Tobacco Use   Smoking status: Every Day   Smokeless tobacco: Never  Substance Use Topics   Alcohol use: Yes    Alcohol/week: 0.0 standard drinks of alcohol   Drug use: No     Allergies   Codeine and Hydrocodone   Review of Systems Review of Systems  Constitutional:  Positive for activity change, appetite change, chills, diaphoresis, fatigue and fever.  HENT:  Positive for congestion, ear pain (right), postnasal drip, sinus pressure, sinus pain and sore throat. Negative for  ear discharge, mouth sores and trouble swallowing.   Eyes:  Negative for pain, discharge, redness and itching.  Respiratory:  Positive for cough. Negative for chest tightness, shortness of breath and wheezing.   Gastrointestinal:  Negative for diarrhea, nausea and vomiting.  Musculoskeletal:  Positive for arthralgias and myalgias. Negative for neck pain and neck stiffness.  Skin:  Negative for color change and rash.  Allergic/Immunologic: Negative for environmental allergies, food allergies and immunocompromised state.  Neurological:  Positive for headaches. Negative for dizziness, tremors, seizures, syncope, speech difficulty and numbness.  Hematological:  Negative for adenopathy. Does not bruise/bleed easily.  Psychiatric/Behavioral:  Positive for sleep disturbance.      Physical Exam Triage Vital Signs ED Triage Vitals  Enc Vitals Group     BP 01/14/22 0921 116/80     Pulse Rate 01/14/22 0921 78     Resp --      Temp 01/14/22 0921 98.2 F (36.8 C)     Temp Source 01/14/22 0921 Oral     SpO2 01/14/22 0921 99 %     Weight 01/14/22 0918 163 lb (73.9 kg)     Height 01/14/22 0918 5\' 10"  (1.778 m)     Head Circumference --      Peak Flow --  Pain Score 01/14/22 0918 0     Pain Loc --      Pain Edu? --      Excl. in GC? --    No data found.  Updated Vital Signs BP 116/80 (BP Location: Left Arm)   Pulse 78   Temp 98.2 F (36.8 C) (Oral)   Ht 5\' 10"  (1.778 m)   Wt 163 lb (73.9 kg)   SpO2 99%   BMI 23.39 kg/m   Visual Acuity Right Eye Distance:   Left Eye Distance:   Bilateral Distance:    Right Eye Near:   Left Eye Near:    Bilateral Near:     Physical Exam Vitals and nursing note reviewed.  Constitutional:      General: He is awake. He is not in acute distress.    Appearance: He is well-developed and well-groomed. He is ill-appearing.     Comments: He is sitting on the exam table in no acute distress but appears tired and ill.   HENT:     Head:  Normocephalic and atraumatic.     Right Ear: Hearing, ear canal and external ear normal. No drainage. There is no impacted cerumen. Tympanic membrane is injected, erythematous and bulging. Tympanic membrane is not perforated.     Left Ear: Hearing, ear canal and external ear normal. No drainage. There is no impacted cerumen. Tympanic membrane is bulging. Tympanic membrane is not injected, perforated or erythematous.     Nose: Congestion present.     Right Sinus: No maxillary sinus tenderness or frontal sinus tenderness.     Left Sinus: No maxillary sinus tenderness or frontal sinus tenderness.     Mouth/Throat:     Lips: Pink.     Mouth: Mucous membranes are moist.     Pharynx: Uvula midline. Posterior oropharyngeal erythema present. No pharyngeal swelling, oropharyngeal exudate or uvula swelling.  Eyes:     Extraocular Movements: Extraocular movements intact.     Conjunctiva/sclera: Conjunctivae normal.  Cardiovascular:     Rate and Rhythm: Normal rate and regular rhythm.     Heart sounds: Normal heart sounds. No murmur heard. Pulmonary:     Effort: Pulmonary effort is normal. No respiratory distress.     Breath sounds: Normal breath sounds and air entry. No decreased air movement. No decreased breath sounds, wheezing, rhonchi or rales.  Musculoskeletal:     Cervical back: Normal range of motion and neck supple.  Lymphadenopathy:     Cervical: Cervical adenopathy present.     Right cervical: Superficial cervical adenopathy present.     Left cervical: No superficial cervical adenopathy.  Skin:    General: Skin is warm and dry.     Capillary Refill: Capillary refill takes less than 2 seconds.     Findings: No rash.  Neurological:     General: No focal deficit present.     Mental Status: He is alert and oriented to person, place, and time.  Psychiatric:        Mood and Affect: Mood normal.        Behavior: Behavior normal. Behavior is cooperative.        Thought Content: Thought  content normal.        Judgment: Judgment normal.      UC Treatments / Results  Labs (all labs ordered are listed, but only abnormal results are displayed) Labs Reviewed - No data to display  EKG   Radiology No results found.  Procedures Procedures (including critical care  time)  Medications Ordered in UC Medications - No data to display  Initial Impression / Assessment and Plan / UC Course  I have reviewed the triage vital signs and the nursing notes.  Pertinent labs & imaging results that were available during my care of the patient were reviewed by me and considered in my medical decision making (see chart for details).     Reviewed with patient that he has a right inner ear infection. Recommend start Augmentin 875mg  twice a day for 7 days. May take OTC Sudafed 30-60mg  every 8 hours as needed for congestion. May continue OTC Tylenol 1000mg  and alternate every 4 hours with Ibuprofen 600mg  as needed for pain or fever. Increase fluids to help loosen up mucus in sinuses. Follow-up in 3 to 4 days if not improving.  Final Clinical Impressions(s) / UC Diagnoses   Final diagnoses:  Right acute otitis media  Nasal congestion  Sore throat  Acute cough     Discharge Instructions      Recommend start Augmentin 875mg  twice a day for 7 days. May take OTC Sudafed 30mg  to 60mg  every 8 hours as needed for congestion. May continue OTC Tylenol 1000mg  and alternate every 4 hours with Ibuprofen 600mg  as needed. Increase fluids to help loosen up mucus in sinuses. Follow-up in 3 to 4 days if not improving.     ED Prescriptions     Medication Sig Dispense Auth. Provider   amoxicillin-clavulanate (AUGMENTIN) 875-125 MG tablet Take 1 tablet by mouth every 12 (twelve) hours for 7 days. 14 tablet Terra Aveni, , NP      PDMP not reviewed this encounter.   , NP 01/15/22 [id number]

## 2023-06-11 ENCOUNTER — Ambulatory Visit: Payer: Self-pay

## 2023-06-11 DIAGNOSIS — K641 Second degree hemorrhoids: Secondary | ICD-10-CM

## 2023-06-11 DIAGNOSIS — Z1211 Encounter for screening for malignant neoplasm of colon: Secondary | ICD-10-CM
